# Patient Record
Sex: Male | Born: 1985 | Race: White | Hispanic: No | Marital: Married | Smoking: Never smoker
Health system: Southern US, Community
[De-identification: ages and names within clinical notes are randomized; demographics above are authoritative.]

## PROBLEM LIST (undated history)

## (undated) DIAGNOSIS — I10 Essential (primary) hypertension: Secondary | ICD-10-CM

## (undated) HISTORY — DX: Essential (primary) hypertension: I10

---

## 2011-07-01 ENCOUNTER — Ambulatory Visit (HOSPITAL_COMMUNITY)
Admission: RE | Admit: 2011-07-01 | Discharge: 2011-07-01 | Disposition: A | Discharge status: Home / Self Care | Payer: BC Managed Care – PPO | Source: Ambulatory Visit | Attending: Family Medicine | Admitting: Family Medicine

## 2011-07-01 ENCOUNTER — Other Ambulatory Visit: Payer: Self-pay | Admitting: Family Medicine

## 2011-07-01 ENCOUNTER — Other Ambulatory Visit (HOSPITAL_COMMUNITY): Payer: Self-pay | Admitting: Family Medicine

## 2011-07-01 DIAGNOSIS — R52 Pain, unspecified: Secondary | ICD-10-CM

## 2011-07-01 DIAGNOSIS — N508 Other specified disorders of male genital organs: Secondary | ICD-10-CM | POA: Insufficient documentation

## 2011-07-01 DIAGNOSIS — N509 Disorder of male genital organs, unspecified: Secondary | ICD-10-CM | POA: Insufficient documentation

## 2011-12-14 ENCOUNTER — Other Ambulatory Visit: Payer: Self-pay | Admitting: Family Medicine

## 2011-12-14 DIAGNOSIS — N433 Hydrocele, unspecified: Secondary | ICD-10-CM

## 2011-12-16 ENCOUNTER — Ambulatory Visit (HOSPITAL_COMMUNITY): Payer: BC Managed Care – PPO

## 2012-07-03 IMAGING — US US SCROTUM
1 series · 14 of 25 positions shown · non-contrast
Comparison: None.

CLINICAL DATA: Pain

SCROTAL ULTRASOUND
DOPPLER ULTRASOUND OF THE TESTICLES
TECHNIQUE: Complete ultrasound examination of the testicles,
epididymis, and other scrotal structures was performed.  Color and
spectral Doppler ultrasound were also utilized to evaluate blood
flow to the testicles.

[Series 1: us scrotum · 0.08mm/px · 14 of 74 slices shown]
[im 1/74]
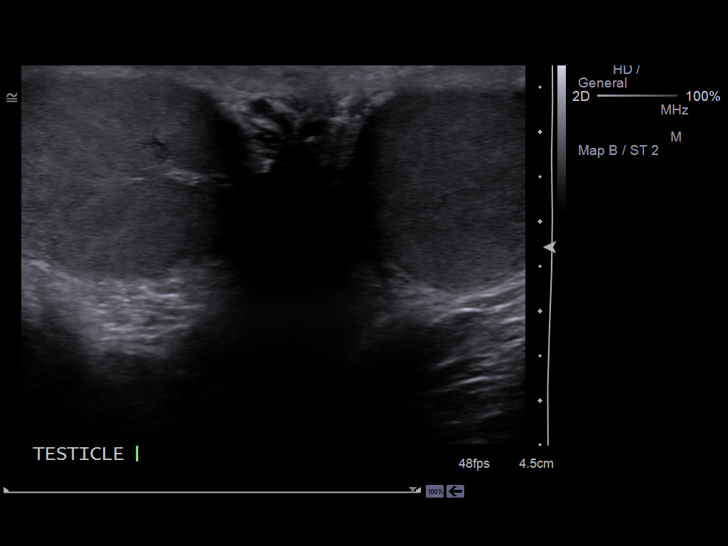
[im 7/74]
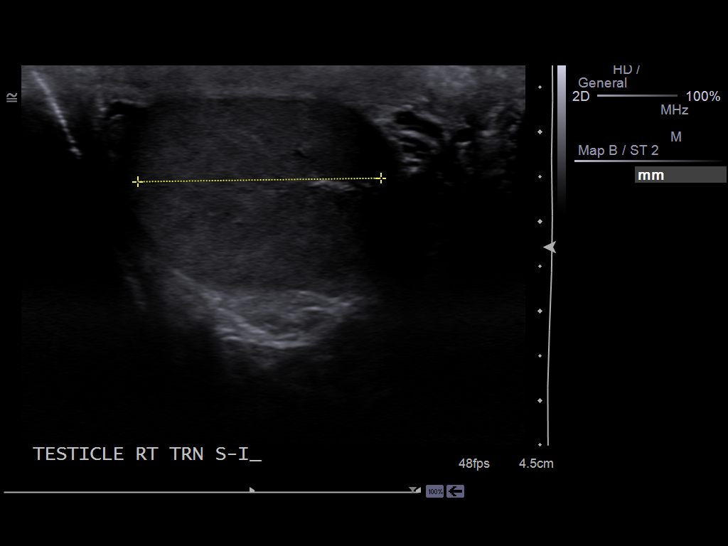
[im 13/74]
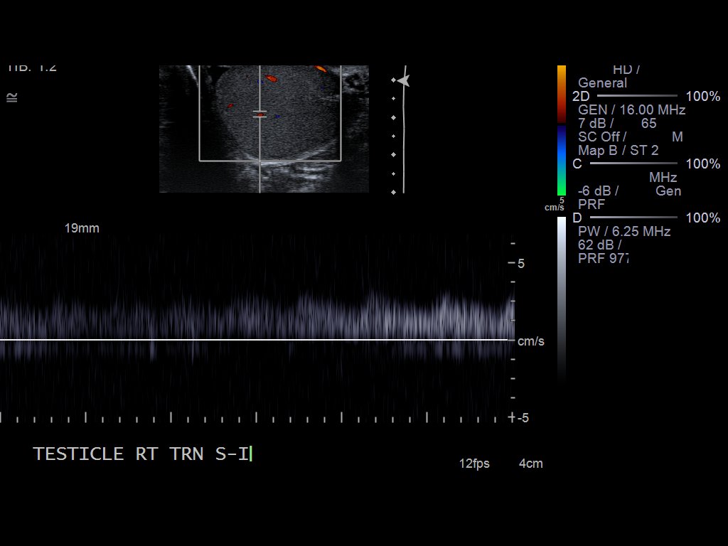
[im 19/74]
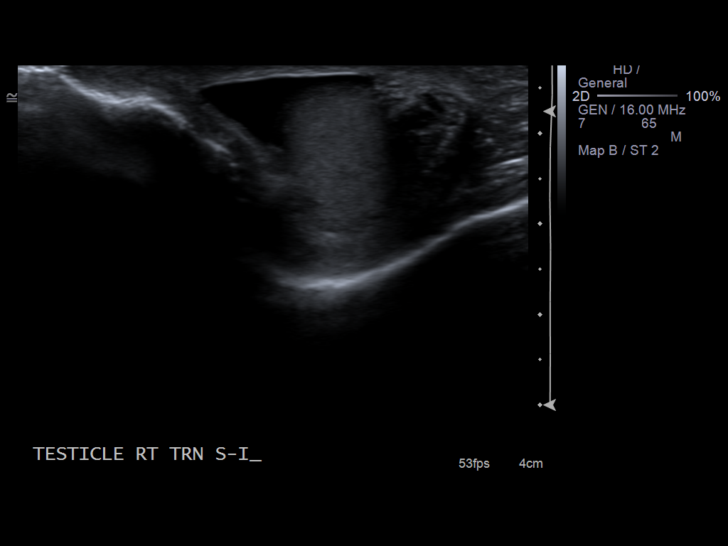
[im 25/74]
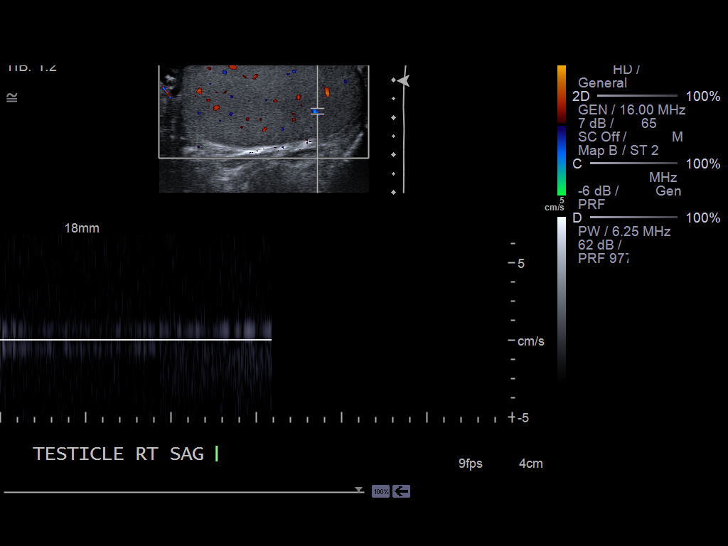
[im 28/74]
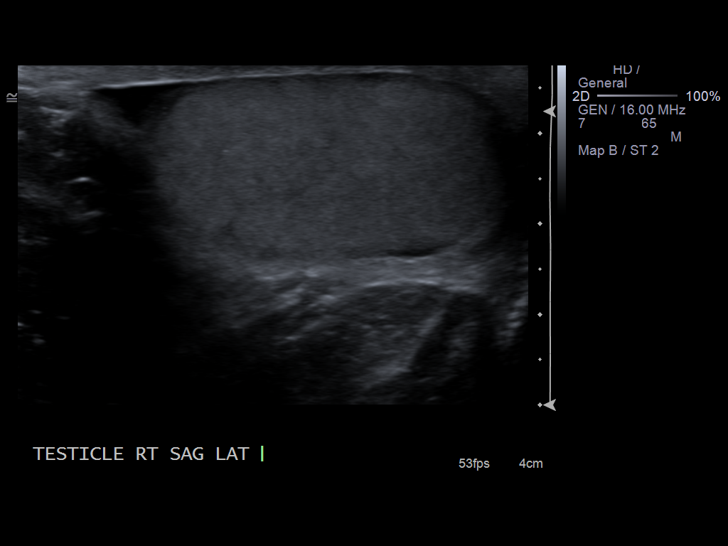
[im 34/74]
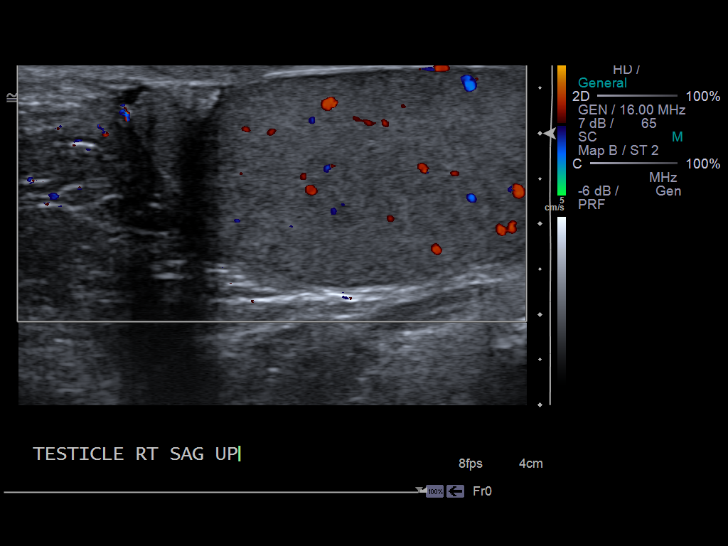
[im 40/74]
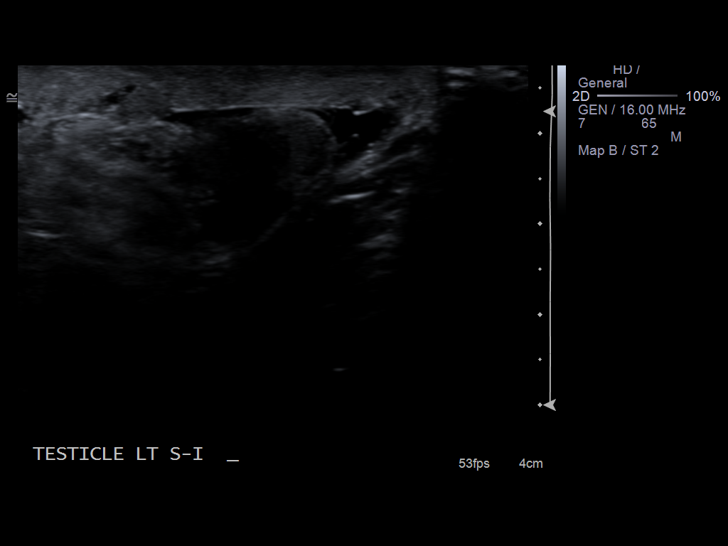
[im 46/74]
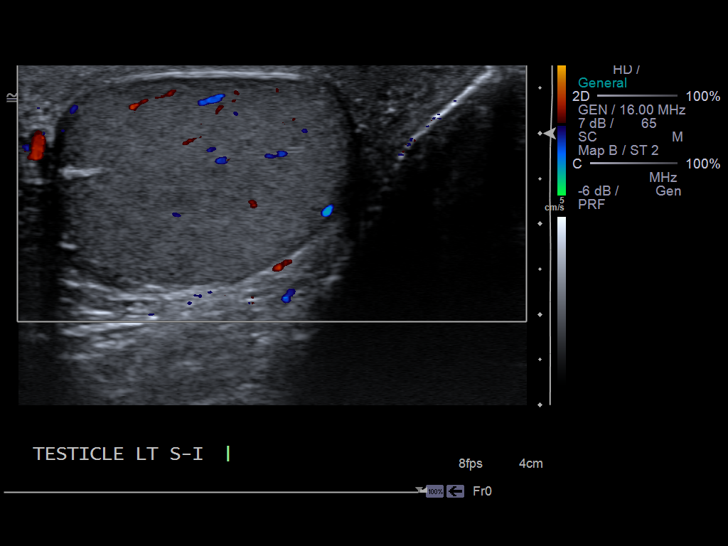
[im 49/74]
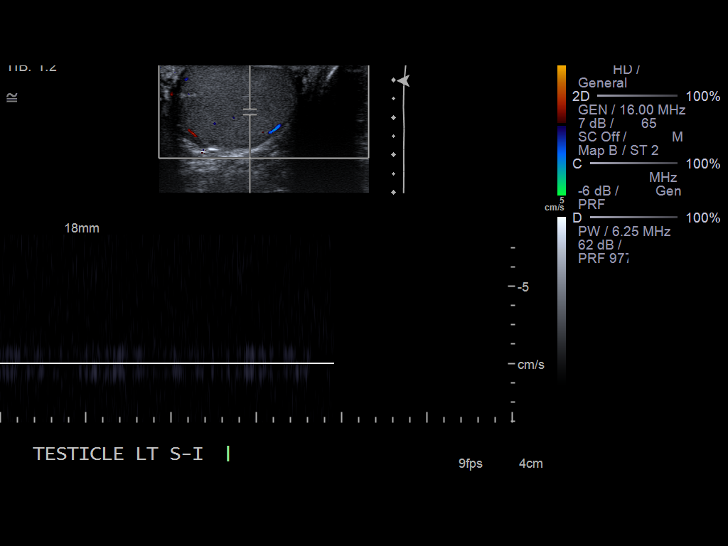
[im 55/74]
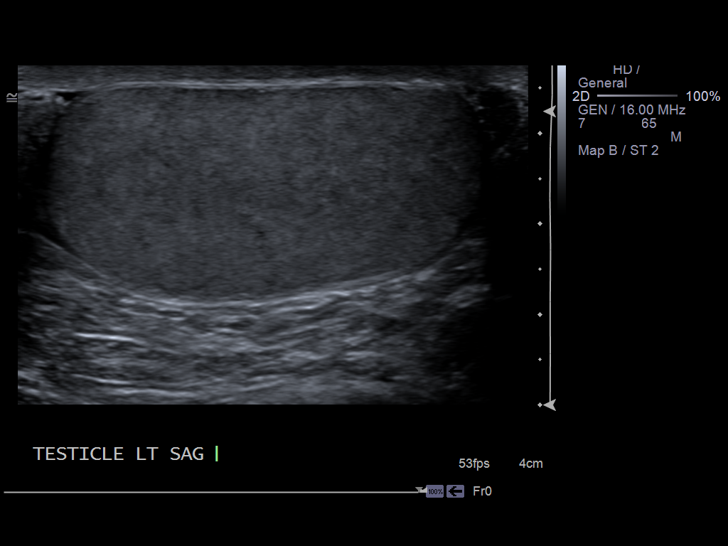
[im 61/74]
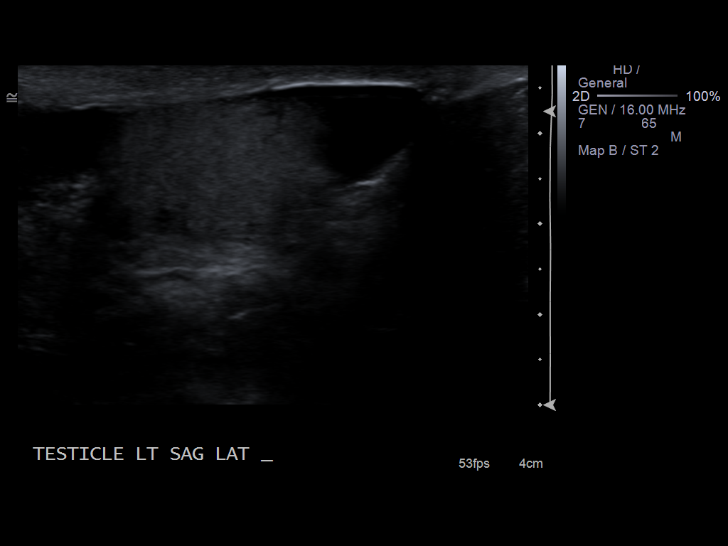
[im 67/74]
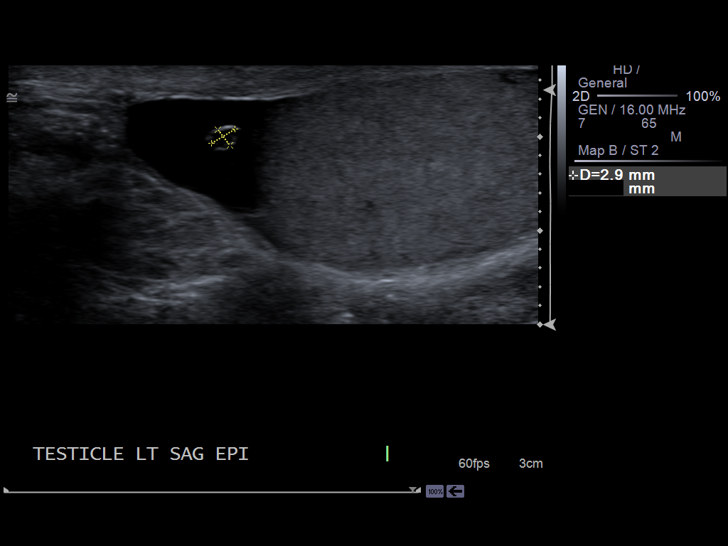
[im 74/74]
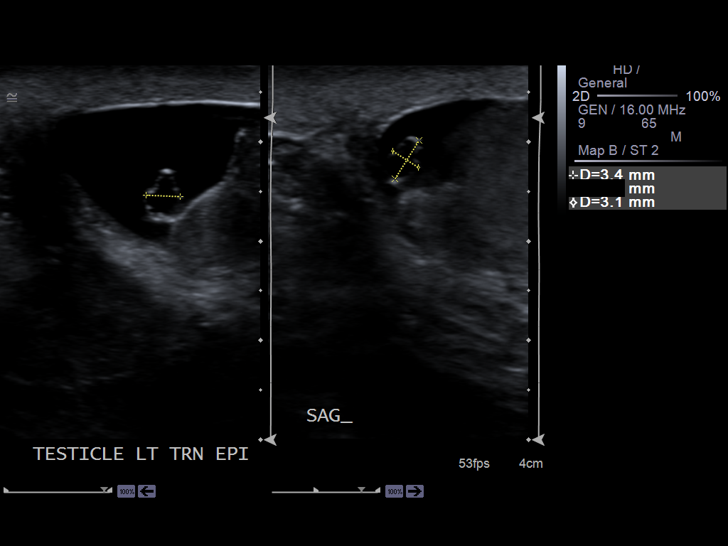

[14 of 25 positions shown; findings below may reference images not displayed]

FINDINGS: The testicles are symmetric in size and echogenicity.
No testicular masses are seen, and there is no evidence of
microlithiasis.

The right epididymis appears normal.  There is a small cyst within
the left epididymis measuring 3.4 x 4.6 x 3.1 mm.

There is no evidence of varicocele, or other extra-testicular
abnormality.

Small bilateral hydroceles.

Blood flow is seen within both testicles on color Doppler
sonography.  Doppler spectral waveforms show both arterial and
venous flow signal in both testicles.
IMPRESSION: 1.  No evidence for testicular mass or torsion.
2.  Small bilateral hydroceles.
3.  Left epididymal cyst.

## 2012-10-10 ENCOUNTER — Encounter: Payer: Self-pay | Admitting: Family Medicine

## 2012-10-16 ENCOUNTER — Ambulatory Visit (INDEPENDENT_AMBULATORY_CARE_PROVIDER_SITE_OTHER): Payer: BC Managed Care – PPO | Admitting: Family Medicine

## 2012-10-16 ENCOUNTER — Encounter: Payer: Self-pay | Admitting: Family Medicine

## 2012-10-16 ENCOUNTER — Ambulatory Visit (INDEPENDENT_AMBULATORY_CARE_PROVIDER_SITE_OTHER): Payer: BC Managed Care – PPO

## 2012-10-16 VITALS — BP 110/90 | HR 96 | Temp 97.8°F | Ht 66.0 in | Wt 251.0 lb

## 2012-10-16 DIAGNOSIS — Z0000 Encounter for general adult medical examination without abnormal findings: Secondary | ICD-10-CM

## 2012-10-16 DIAGNOSIS — E669 Obesity, unspecified: Secondary | ICD-10-CM

## 2012-10-16 DIAGNOSIS — I10 Essential (primary) hypertension: Secondary | ICD-10-CM

## 2012-10-16 MED ORDER — LISINOPRIL 5 MG PO TABS: 5.0000 mg | ORAL_TABLET | Freq: Every day | ORAL | Status: DC

## 2012-10-16 NOTE — Patient Instructions (Addendum)
FOBT given Discussed therapeutic lifestyle changes and exercise Recheck in 4-6 weeks and get BMP  Monitor blood pressures  by the nurse at work Arrange appointment with Tammy the clinical pharmacist to help him work with his diet and lose weight Should try to get eye exam

## 2012-10-16 NOTE — Progress Notes (Addendum)
  Subjective:    Patient ID: David Johnston, male    DOB: [birth year], 27 y.o.   MRN: [id number]  HPI   Patient presents today for complete physical exam. Patient has a history of several weeks of blood pressure elevated and monitored by the nurse at work. He has no history of symptoms associated with this .   Review of Systems  Constitutional: Negative.   HENT: Negative.   Eyes: Positive for itching (this AM).  Respiratory: Negative.   Cardiovascular: Negative.   Gastrointestinal: Negative.   Genitourinary: Negative.   Musculoskeletal: Negative.   Allergic/Immunologic: Negative.   Neurological: Negative.   Psychiatric/Behavioral: Positive for sleep disturbance (due to snoring).       Objective:   Physical Exam   BP 136/97  Pulse 70  Temp(Src) 97.8 F (36.6 C) (Oral)  Ht 5\' 6"  (1.676 m)  Wt 251 lb (113.853 kg)  BMI 40.53 kg/m2  The patient appeared well nourished and normally developed but overweight, alert and oriented to time and place. Speech, behavior and judgement appear normal. Vital signs as documented.  Head exam is unremarkable.Mouth and ears were normal.Slight nasal congestion bilaterally. No carotid bruit.  Carotid upstrokes are brisk bilaterally. No cervical adenopathy. No thyroid enlargement Lungs are clear anteriorly and posteriorly to auscultation. Normal respiratory effort. Cardiac exam reveals regular rate and rhythm @ 96/min. First and second heart sounds normal. No murmurs, rubs or gallops.  Abdominal exam reveals normal bowl sounds, no masses, no organomegaly and no aortic enlargement. No inguinal adenopathy. There is obesity. Rectal exam was negative. Was unable to feel bilateral hydroceles today. No hernia palpated. Extremities are nonedematous and both femoral and pedal pulses are normal. Skin without pallor or jaundice.  Warm and dry, without rash. Neurologic exam reveals normal deep tendon reflexes and normal sensation.   EKG: normal EKG,  slightly irregular rhythm [facility] reading (PRIMARY) by  Dr. Rudi Heap, chest x-ray appears to be within normal limits cardiac size is normal lungs appear to be clear                                       Assessment & Plan:  Complete physical exam  Increased blood pressure History of bilateral hydrocele, appear smaller today   Appointment with Tammy to help with weight loss BMP and blood pressure rechecked in about 4 weeks, this could be done with Tammy's visit Bring outside blood pressure readings to this visit

## 2012-10-17 NOTE — Addendum Note (Signed)
Addended by: Ernestina Penna on: 10/17/2012 02:31 PM   Modules accepted: Orders, Level of Service

## 2012-11-13 ENCOUNTER — Ambulatory Visit (INDEPENDENT_AMBULATORY_CARE_PROVIDER_SITE_OTHER): Payer: BC Managed Care – PPO | Admitting: Pharmacist

## 2012-11-13 VITALS — BP 122/86 | HR 72 | Ht 66.0 in | Wt 254.0 lb

## 2012-11-13 DIAGNOSIS — I10 Essential (primary) hypertension: Secondary | ICD-10-CM

## 2012-11-13 NOTE — Progress Notes (Signed)
Filed Vitals:   11/13/12 1448  BP: 122/86  Pulse: 72    Filed Weights   11/13/12 1448  Weight: 254 lb (115.214 kg)   Body mass index is 41.02 kg/(m^2).   Mrs. Nees was referred by Dr. Rudi Heap for obesity and hypertension.   Patient has tried restricting calories in past with minimal success.  Patient had labs recently checked by his employer that showed normal lipid panel, FBG and BMET.  Patient has recently started lisinopril 5mg  daily with good results.  He is having workplace nurse check BP occasionally at work.   SH:  Married, no children, non smoker, works for [employer].   Exercise - occassional - weight lifting Diet - eat out during day  Breakfast - Honey Bunches of Oats cereal w/ 2% milk  Lunch - [place] sandwich, [place]  Supper - salmon, pork chops, hamburger steak, chicken + vegetables - green beans, corn Salad with 1000 island dressing.  Snacks - not usually  Drinks sweet tea regularly    Assessment: Obesity with BMI greater than 40 Comorbidites - hypertension Signs of hypercortisolism: none Contraindications to weight loss: none Patient readiness to commit to diet and activity changes: excellent Barriers to weight loss: time limitations  Plan: 1. Goal weight loss is 15 lbs in 3 -4 months.   2. Diet interventions: moderate (500 kCal/d) deficit diet  Proper food choices reviewed: yes 1. Discontinue drinking sweet tea 2. Increase lean proteins and whole grains 3. Increase fresh vegetable and fruits  Preparation techniques reviewed: yes  Careful meal planning, specifically adding a few low calorie snacks  Handouts given: Info about fast food menu ([employer]), sample 5 day diet 3. Exercise intervention:   Informal measures, e.g. taking stairs instead of elevator: yes  Formal exercise regimen: yes (add cardio exercise to weight training.  Specifially suggested cycling to prevent aggrevation of knee pain/shin  splints.) 4. Patient to keep a weight log that we will review at follow up. 5. Follow up: 1 month and as needed.

## 2012-11-14 LAB — BASIC METABOLIC PANEL WITH GFR
CO2: 27 mEq/L (ref 19–32)
Calcium: 9.2 mg/dL (ref 8.4–10.5)
Chloride: 102 mEq/L (ref 96–112)
GFR, Est Non African American: >89 mL/min
Sodium: 137 mEq/L (ref 135–145)

## 2012-11-15 ENCOUNTER — Telehealth: Payer: Self-pay | Admitting: Pharmacist

## 2012-11-15 NOTE — Telephone Encounter (Signed)
BMP = WNL Continue lisinoril 5mg  daily

## 2013-10-19 IMAGING — CR DG CHEST 2V
2 series · 2 of 2 positions shown · non-contrast
Comparison: None.

CLINICAL DATA: High blood pressure

CHEST - 2 VIEW

[view not recorded (1 of 2)]
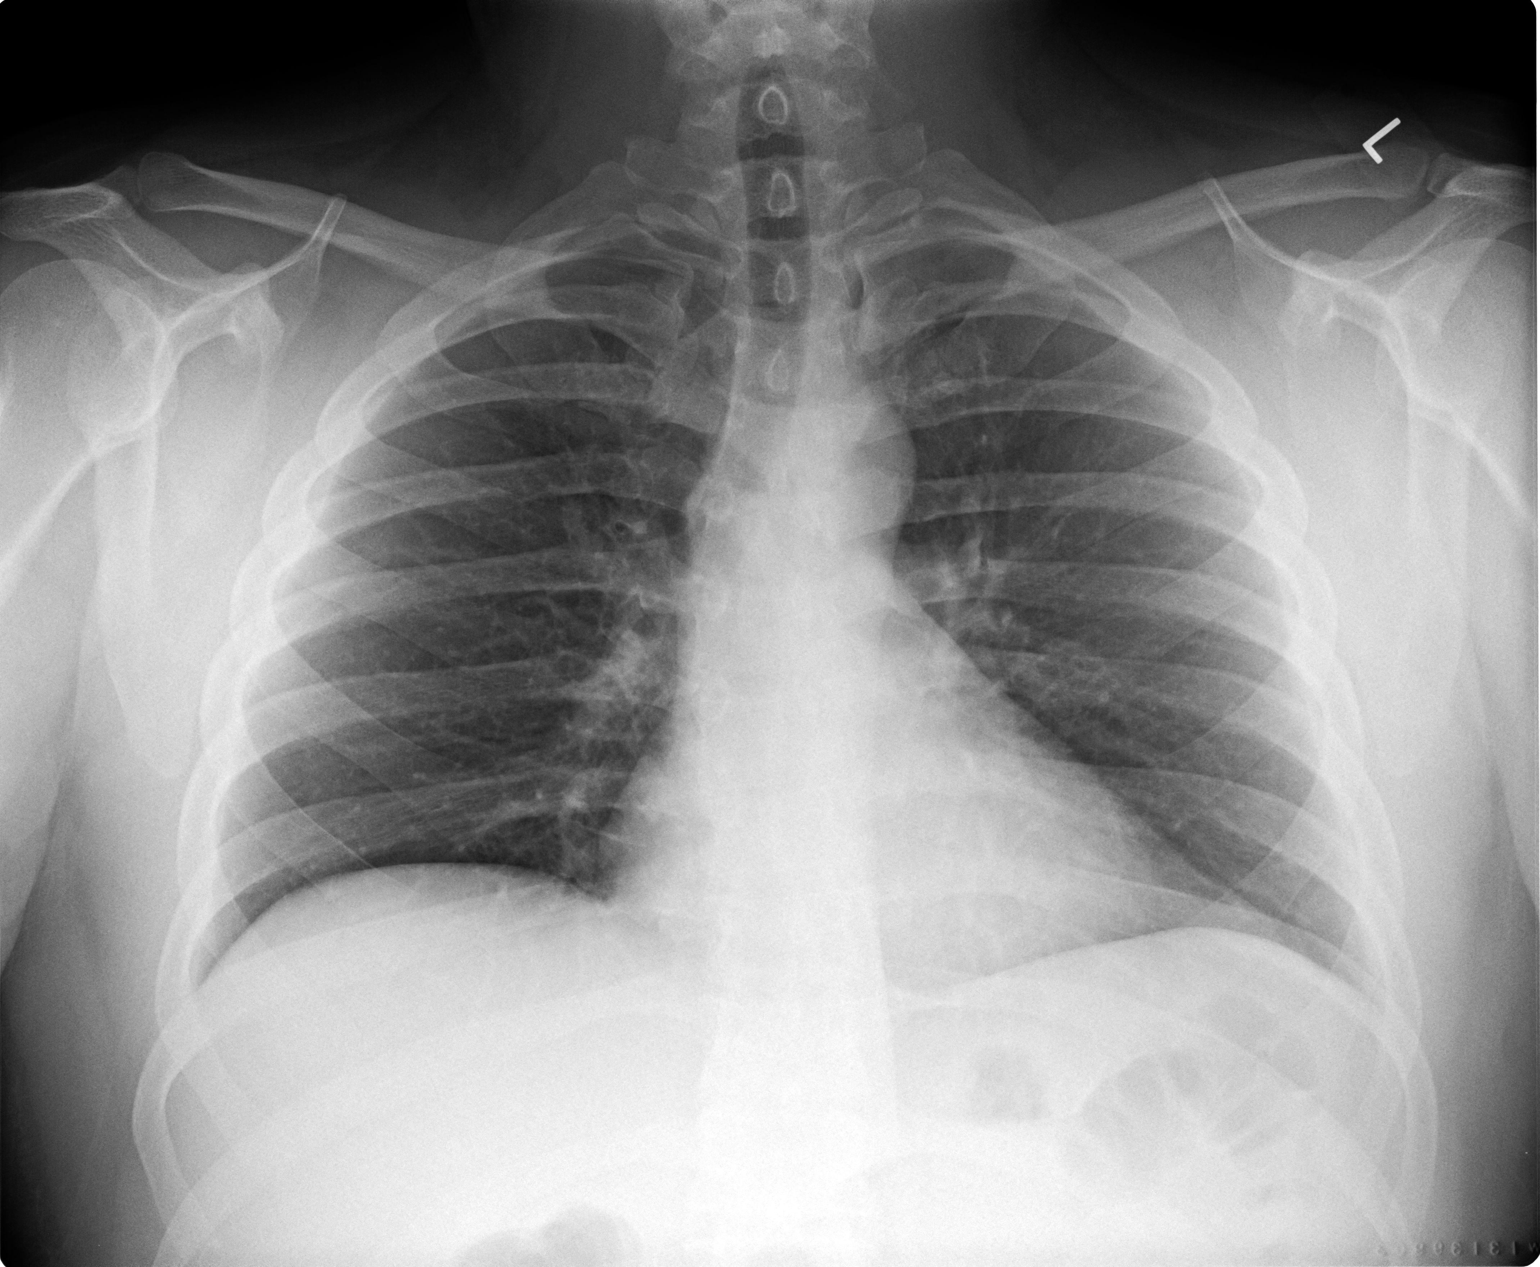

[view not recorded (2 of 2)]
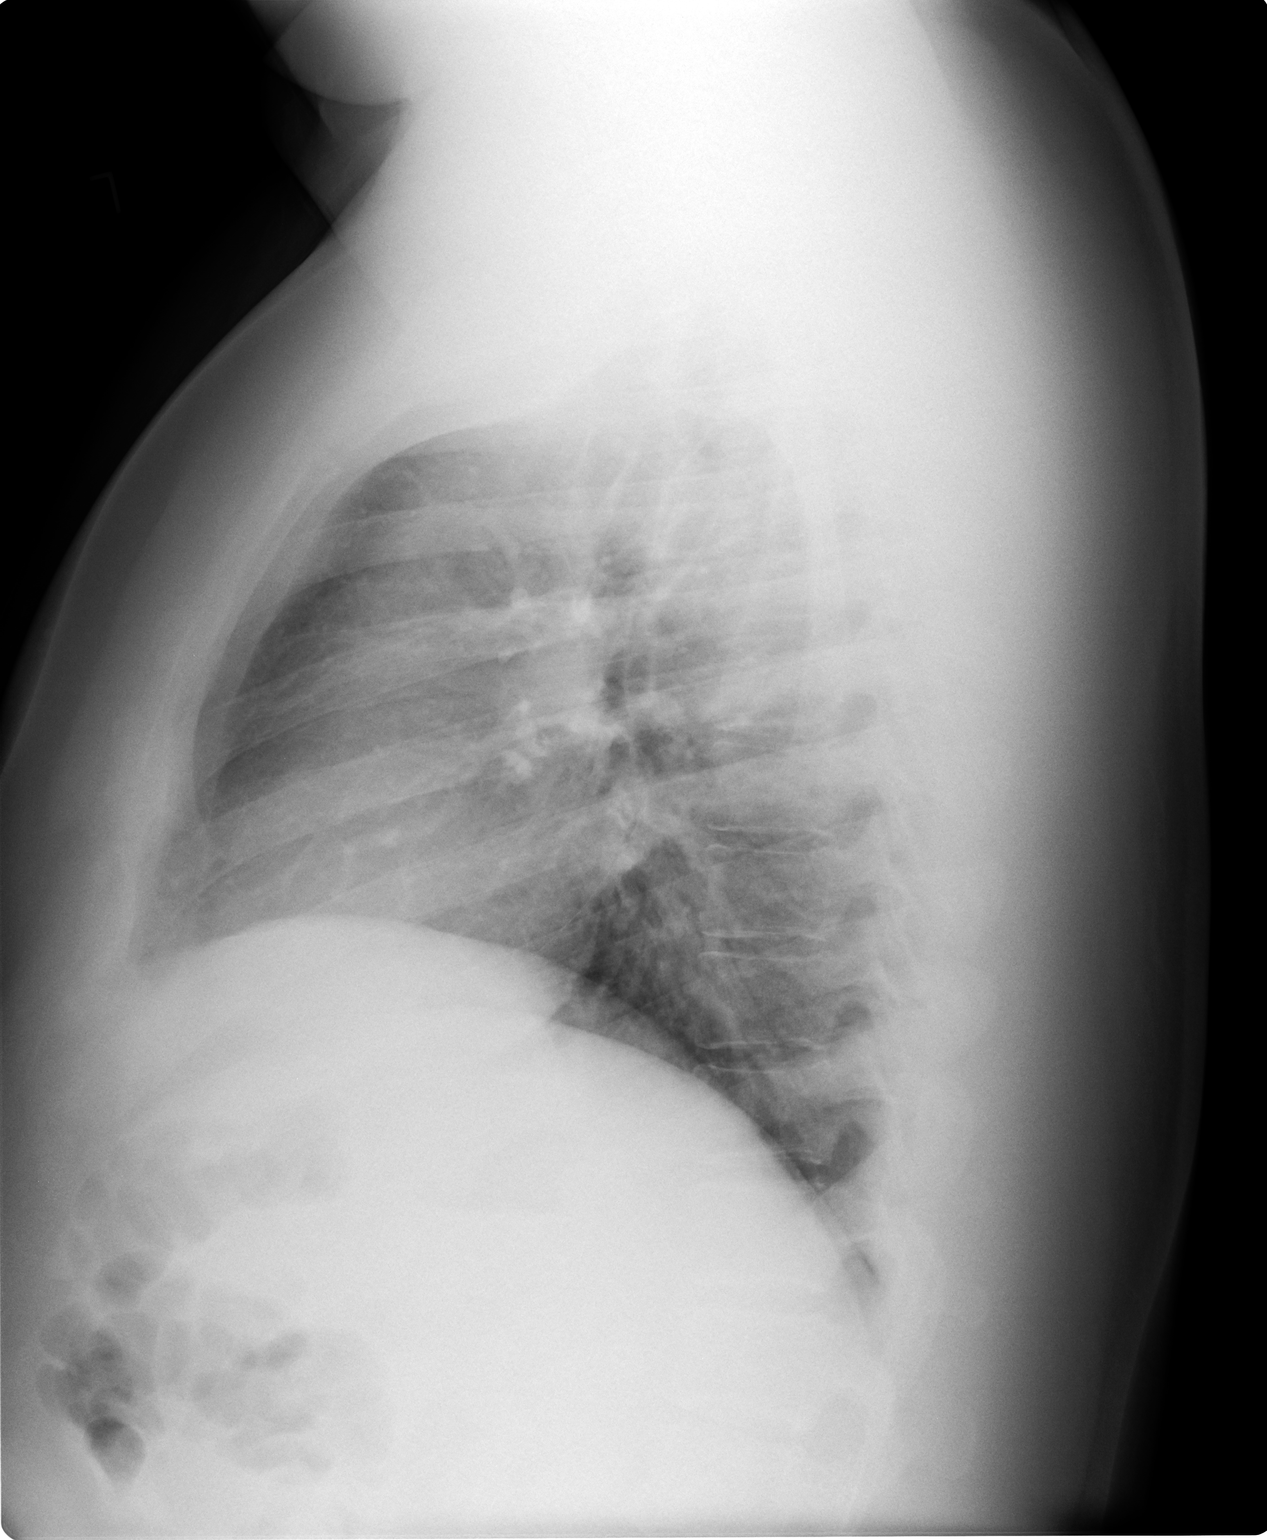

[2 of 2 positions shown; findings below may reference images not displayed]

FINDINGS: No active infiltrate or effusion is seen.  Mediastinal
contours appear normal.  The heart is within normal limits in size.
No bony abnormality is seen.
IMPRESSION: No active lung disease.

## 2016-11-08 ENCOUNTER — Ambulatory Visit (INDEPENDENT_AMBULATORY_CARE_PROVIDER_SITE_OTHER): Payer: BLUE CROSS/BLUE SHIELD | Admitting: Family Medicine

## 2016-11-08 ENCOUNTER — Encounter: Payer: Self-pay | Admitting: Family Medicine

## 2016-11-08 VITALS — BP 138/95 | HR 81 | Temp 97.2°F | Ht 66.0 in | Wt 274.0 lb

## 2016-11-08 DIAGNOSIS — Z0000 Encounter for general adult medical examination without abnormal findings: Secondary | ICD-10-CM | POA: Diagnosis not present

## 2016-11-08 DIAGNOSIS — I10 Essential (primary) hypertension: Secondary | ICD-10-CM

## 2016-11-08 NOTE — Progress Notes (Signed)
BP (!) 154/109   Pulse 81   Temp 97.2 F (36.2 C) (Oral)   Ht '5\' 6"'  (1.676 m)   Wt 274 lb (124.3 kg)   BMI 44.22 kg/m    Subjective:    Patient ID: David Johnston, male    DOB: [birth year], 31 y.o.   MRN: [id number]  HPI: David Johnston is a 31 y.o. male presenting on 11/08/2016 for Annual Exam   HPI Adult well exam and hypertension check Patient is coming in for adult well exam and labs and to get his blood pressure rechecked. They have been monitoring his blood pressure at work at least 4 times a year and he says the top numbers usually less than 140 but the bottom number is frequently over 85 and often over 90.Patient denies any chest pain, shortness of breath, headaches or vision issues, abdominal complaints, diarrhea, nausea, vomiting, or joint issues. Previously he was on a very low-dose of the blood pressure pill that helped him but he has not been on it since. He has gained weight over the past 4 years and his BMI is now 12. We talked about options to help improve that and he does want to make some changes. His wife is currently pregnant with her second child and he wants to get healthier for them. He does admit that he snores loudly at night and may stop breathing sometimes per his wife but it's he does not think it's very often, he will talk to her more about this.  Relevant past medical, surgical, family and social history reviewed and updated as indicated. Interim medical history since our last visit reviewed. Allergies and medications reviewed and updated.  Review of Systems  Constitutional: Negative for chills and fever.  Respiratory: Negative for shortness of breath and wheezing.   Cardiovascular: Negative for chest pain and leg swelling.  Gastrointestinal: Negative for abdominal pain, constipation, diarrhea, nausea and vomiting.  Musculoskeletal: Negative for back pain and gait problem.  Skin: Negative for rash.  All other systems reviewed and are negative.   Per  HPI unless specifically indicated above  Social History   Social History  . Marital status: Married    Spouse name: N/A  . Number of children: N/A  . Years of education: N/A   Occupational History  . Not on file.   Social History Main Topics  . Smoking status: Never Smoker  . Smokeless tobacco: Former Systems developer  . Alcohol use Yes     Comment: occasional  . Drug use: No  . Sexual activity: Yes    Partners: Female   Other Topics Concern  . Not on file   Social History Narrative  . No narrative on file    History reviewed. No pertinent surgical history.  Family History  Problem Relation Age of Onset  . Hypertension Father     Allergies as of 11/08/2016   No Known Allergies     Medication List    as of 11/08/2016  9:35 AM   You have not been prescribed any medications.        Objective:    BP (!) 154/109   Pulse 81   Temp 97.2 F (36.2 C) (Oral)   Ht '5\' 6"'  (1.676 m)   Wt 274 lb (124.3 kg)   BMI 44.22 kg/m   Wt Readings from Last 3 Encounters:  11/08/16 274 lb (124.3 kg)  11/13/12 254 lb (115.2 kg)  10/16/12 251 lb (113.9 kg)    Physical Exam  Constitutional: He is oriented to person, place, and time. He appears well-developed and well-nourished. No distress.  Eyes: Conjunctivae are normal. No scleral icterus.  Neck: Neck supple. No thyromegaly present.  Cardiovascular: Normal rate, regular rhythm, normal heart sounds and intact distal pulses.   No murmur heard. Pulmonary/Chest: Effort normal and breath sounds normal. No respiratory distress. He has no wheezes. He has no rales.  Abdominal: Soft. Bowel sounds are normal. He exhibits no distension. There is no tenderness. There is no rebound.  Genitourinary:  Genitourinary Comments: Patient declined exam today.  Musculoskeletal: Normal range of motion. He exhibits no edema.  Lymphadenopathy:    He has no cervical adenopathy.  Neurological: He is alert and oriented to person, place, and time. Coordination  normal.  Skin: Skin is warm and dry. No rash noted. He is not diaphoretic.  Psychiatric: He has a normal mood and affect. His behavior is normal.  Nursing note and vitals reviewed.       Assessment & Plan:   Problem List Items Addressed This Visit      Cardiovascular and Mediastinum   Essential hypertension, benign   Relevant Orders   CMP14+EGFR   Lipid panel     Other   Severe obesity (BMI >= 40) (HCC)   Relevant Orders   CBC with Differential/Platelet   TSH    Other Visit Diagnoses    Well adult exam    -  Primary   Relevant Orders   CMP14+EGFR   CBC with Differential/Platelet   Lipid panel       Follow up plan: Return in about 4 weeks (around 12/06/2016), or if symptoms worsen or fail to improve, for Hypertension recheck.  Caryl Pina, MD [facility] 11/08/2016, 9:35 AM

## 2016-11-09 LAB — CMP14+EGFR
ALK PHOS: 33 IU/L — AB (ref 39–117)
ALT: 30 IU/L (ref 0–44)
AST: 23 IU/L (ref 0–40)
Albumin/Globulin Ratio: 1.6 (ref 1.2–2.2)
Albumin: 4.3 g/dL (ref 3.5–5.5)
BILIRUBIN TOTAL: 0.4 mg/dL (ref 0.0–1.2)
BUN / CREAT RATIO: 13 (ref 9–20)
BUN: 12 mg/dL (ref 6–20)
CHLORIDE: 100 mmol/L (ref 96–106)
CO2: 25 mmol/L (ref 18–29)
CREATININE: 0.9 mg/dL (ref 0.76–1.27)
Calcium: 9 mg/dL (ref 8.7–10.2)
GFR calc Af Amer: 130 mL/min/{1.73_m2} (ref >59)
GFR calc non Af Amer: 113 mL/min/{1.73_m2} (ref >59)
Globulin, Total: 2.7 g/dL (ref 1.5–4.5)
Glucose: 94 mg/dL (ref 65–99)
POTASSIUM: 4 mmol/L (ref 3.5–5.2)
Sodium: 139 mmol/L (ref 134–144)
Total Protein: 7 g/dL (ref 6.0–8.5)

## 2016-11-09 LAB — LIPID PANEL
Chol/HDL Ratio: 3.9 ratio (ref 0.0–5.0)
Cholesterol, Total: 178 mg/dL (ref 100–199)
HDL: 46 mg/dL (ref >39)
LDL Calculated: 107 mg/dL — ABNORMAL HIGH (ref 0–99)
TRIGLYCERIDES: 124 mg/dL (ref 0–149)
VLDL Cholesterol Cal: 25 mg/dL (ref 5–40)

## 2016-11-09 LAB — CBC WITH DIFFERENTIAL/PLATELET
BASOS ABS: 0 10*3/uL (ref 0.0–0.2)
Basos: 0 %
EOS (ABSOLUTE): 0.1 10*3/uL (ref 0.0–0.4)
Eos: 1 %
Hematocrit: 45.4 % (ref 37.5–51.0)
Hemoglobin: 15.6 g/dL (ref 13.0–17.7)
IMMATURE GRANS (ABS): 0 10*3/uL (ref 0.0–0.1)
IMMATURE GRANULOCYTES: 0 %
LYMPHS ABS: 2.5 10*3/uL (ref 0.7–3.1)
Lymphs: 35 %
MCH: 30.9 pg (ref 26.6–33.0)
MCHC: 34.4 g/dL (ref 31.5–35.7)
MCV: 90 fL (ref 79–97)
MONOS ABS: 0.8 10*3/uL (ref 0.1–0.9)
Monocytes: 12 %
NEUTROS PCT: 52 %
Neutrophils Absolute: 3.8 10*3/uL (ref 1.4–7.0)
PLATELETS: 212 10*3/uL (ref 150–379)
RBC: 5.05 x10E6/uL (ref 4.14–5.80)
RDW: 14.1 % (ref 12.3–15.4)
WBC: 7.3 10*3/uL (ref 3.4–10.8)

## 2016-11-09 LAB — TSH: TSH: 2.79 u[IU]/mL (ref 0.450–4.500)

## 2016-12-08 ENCOUNTER — Ambulatory Visit (INDEPENDENT_AMBULATORY_CARE_PROVIDER_SITE_OTHER): Payer: BLUE CROSS/BLUE SHIELD | Admitting: Family Medicine

## 2016-12-08 ENCOUNTER — Encounter: Payer: Self-pay | Admitting: Family Medicine

## 2016-12-08 VITALS — BP 122/89 | HR 83 | Temp 97.1°F | Ht 66.0 in | Wt 273.0 lb

## 2016-12-08 DIAGNOSIS — I10 Essential (primary) hypertension: Secondary | ICD-10-CM

## 2016-12-08 NOTE — Assessment & Plan Note (Signed)
Patient wants to monitor it for a little bit longer and continue with dietary changes.

## 2016-12-08 NOTE — Progress Notes (Signed)
   BP 122/89   Pulse 83   Temp 97.1 F (36.2 C) (Oral)   Ht 5\' 6"  (1.676 m)   Wt 273 lb (123.8 kg)   BMI 44.06 kg/m    Subjective:    Patient ID: David Johnston, male    DOB: July 08, 1986, 31 y.o.   MRN: [id number]  HPI: David Johnston is a 31 y.o. male presenting on 12/08/2016 for Hypertension (4 week followup)   HPI Hypertension Patient is currently on dietary control and lifestyle changes, and her blood pressure today is 122/89, he says that at his workplace he has been consisting only running in the 88-90 range on the diastolic.Marland Kitchen. Patient denies any lightheadedness or dizziness. Patient denies headaches, blurred vision, chest pains, shortness of breath, or weakness. Denies any side effects from medication and is content with current medication.   Relevant past medical, surgical, family and social history reviewed and updated as indicated. Interim medical history since our last visit reviewed. Allergies and medications reviewed and updated.  Review of Systems  Constitutional: Negative for chills and fever.  Respiratory: Negative for shortness of breath and wheezing.   Cardiovascular: Negative for chest pain and leg swelling.  Musculoskeletal: Negative for back pain and gait problem.  Skin: Negative for rash.  Neurological: Negative for dizziness, weakness, light-headedness and headaches.  All other systems reviewed and are negative.   Per HPI unless specifically indicated above        Objective:    BP 122/89   Pulse 83   Temp 97.1 F (36.2 C) (Oral)   Ht 5\' 6"  (1.676 m)   Wt 273 lb (123.8 kg)   BMI 44.06 kg/m   Wt Readings from Last 3 Encounters:  12/08/16 273 lb (123.8 kg)  11/08/16 274 lb (124.3 kg)  11/13/12 254 lb (115.2 kg)    Physical Exam  Constitutional: He is oriented to person, place, and time. He appears well-developed and well-nourished. No distress.  Eyes: Conjunctivae are normal. No scleral icterus.  Neck: Neck supple. No thyromegaly present.    Cardiovascular: Normal rate, regular rhythm, normal heart sounds and intact distal pulses.   No murmur heard. Pulmonary/Chest: Effort normal and breath sounds normal. No respiratory distress. He has no wheezes. He has no rales.  Musculoskeletal: Normal range of motion. He exhibits no edema.  Lymphadenopathy:    He has no cervical adenopathy.  Neurological: He is alert and oriented to person, place, and time. Coordination normal.  Skin: Skin is warm and dry. No rash noted. He is not diaphoretic.  Psychiatric: He has a normal mood and affect. His behavior is normal.  Nursing note and vitals reviewed.       Assessment & Plan:   Problem List Items Addressed This Visit      Cardiovascular and Mediastinum   Essential hypertension, benign - Primary    Patient wants to monitor it for a little bit longer and continue with dietary changes.           Follow up plan: Return in about 6 months (around 06/10/2017), or if symptoms worsen or fail to improve, for Hypertension.  Counseling provided for all of the vaccine components No orders of the defined types were placed in this encounter.   Arville CareJoshua Dettinger, MD [facility] 12/08/2016, 9:19 AM

## 2017-02-02 ENCOUNTER — Telehealth: Payer: Self-pay | Admitting: Family Medicine

## 2017-02-02 NOTE — Telephone Encounter (Signed)
That is fine we can change it to a level II

## 2017-02-02 NOTE — Telephone Encounter (Signed)
Patient called and is upset he is being billed $143 (applied to deductible) for his 5 minute visit about his blood pressure. He wants to know if you can change to a lower level? If you agree I can correct to level 2 instead of level 3. Thanks NVR Inconya

## 2017-05-30 ENCOUNTER — Ambulatory Visit: Payer: BLUE CROSS/BLUE SHIELD | Admitting: Family Medicine

## 2018-09-22 ENCOUNTER — Ambulatory Visit (INDEPENDENT_AMBULATORY_CARE_PROVIDER_SITE_OTHER): Payer: Commercial Managed Care - PPO

## 2018-09-22 ENCOUNTER — Ambulatory Visit (INDEPENDENT_AMBULATORY_CARE_PROVIDER_SITE_OTHER): Payer: Commercial Managed Care - PPO | Admitting: Podiatry

## 2018-09-22 ENCOUNTER — Other Ambulatory Visit: Payer: Self-pay

## 2018-09-22 DIAGNOSIS — M79671 Pain in right foot: Secondary | ICD-10-CM

## 2018-09-22 DIAGNOSIS — M722 Plantar fascial fibromatosis: Secondary | ICD-10-CM

## 2018-09-22 DIAGNOSIS — M216X1 Other acquired deformities of right foot: Secondary | ICD-10-CM

## 2018-09-22 NOTE — Patient Instructions (Signed)

## 2018-09-24 ENCOUNTER — Encounter: Payer: Self-pay | Admitting: Podiatry

## 2018-09-25 ENCOUNTER — Ambulatory Visit (INDEPENDENT_AMBULATORY_CARE_PROVIDER_SITE_OTHER): Payer: Commercial Managed Care - PPO | Admitting: Podiatry

## 2018-09-25 ENCOUNTER — Telehealth: Payer: Self-pay | Admitting: Podiatry

## 2018-09-25 ENCOUNTER — Other Ambulatory Visit: Payer: Self-pay

## 2018-09-25 DIAGNOSIS — M722 Plantar fascial fibromatosis: Secondary | ICD-10-CM

## 2018-09-25 DIAGNOSIS — M779 Enthesopathy, unspecified: Secondary | ICD-10-CM | POA: Diagnosis not present

## 2018-09-25 DIAGNOSIS — M778 Other enthesopathies, not elsewhere classified: Secondary | ICD-10-CM

## 2018-09-25 MED ORDER — HYDROCODONE-ACETAMINOPHEN 5-325 MG PO TABS: 1.0000 | ORAL_TABLET | Freq: Three times a day (TID) | ORAL | 0 refills | Status: AC

## 2018-09-25 MED ORDER — MELOXICAM 15 MG PO TABS: 15.0000 mg | ORAL_TABLET | Freq: Every day | ORAL | 1 refills | Status: AC

## 2018-09-25 NOTE — Telephone Encounter (Signed)
I requested schedulers to contact pt for an appt today, pt is scheduled today at 3:15pm with Dr. Logan Bores.

## 2018-09-25 NOTE — Telephone Encounter (Signed)
Patient was seen this past Friday. Received an injection in his foot-he has severe pain and its very swollen. Please call  Patient

## 2018-09-25 NOTE — Telephone Encounter (Signed)
Patient called following up on earlier call, pt was informed that the nurse would give him a call back as soon as she could.

## 2018-09-26 NOTE — Progress Notes (Signed)
   HPI: 33 year old male presents the office today after having an anti-inflammatory injection on 09/22/2022 plantar fasciitis to the right foot by Dr. Samuella Cota here in the office.  He states that over the weekend he notices significant amount of pain.  He is unable to work and put pressure on his foot.  He says by Saturday morning he was in so much pain he could not walk.  Patient continues to have significant pain with throbbing to the lateral aspect of the foot with tightness.  He also has noticed some swelling to the area.  He presents for further treatment evaluation  Past Medical History:  Diagnosis Date  . Hypertension      Physical Exam: General: The patient is alert and oriented x3 in no acute distress.  Dermatology: Skin is warm, dry and supple bilateral lower extremities. Negative for open lesions or macerations.  Vascular: Palpable pedal pulses bilaterally. Capillary refill within normal limits.  Skin is warm to touch with some mild erythema and edema  Neurological: Epicritic and protective threshold grossly intact bilaterally.   Musculoskeletal Exam: Range of motion within normal limits to all pedal and ankle joints bilateral. Muscle strength 5/5 in all groups bilateral.  Significant amount of pain on palpation diffusely throughout the foot  Assessment: 1.  Acute flare reaction secondary to steroidal anti-inflammatory injection   Plan of Care:  1. Patient evaluated. 2.  Today we will prescribe meloxicam 15 mg daily anti-inflammatory to help patient alleviate some symptoms 3.  Prescription for Vicodin 5/325 mg #20 every 8 hours as needed severe acute pain 4.  Today we will place the patient in a immobilization cam boot.  Weightbearing as tolerated.  Cam boot dispensed today 5.  Return to clinic in 2 weeks with Dr. Estanislado Pandy, DPM [facility]  Dr. Felecia Shelling, DPM    [address] [place], [place]  [address]                Office [phone]  Fax [phone]

## 2018-10-10 NOTE — Progress Notes (Signed)
  Subjective:  Patient ID: David Johnston, male    DOB: Jan 11, 1986,  MRN: [id number]  Chief Complaint  Patient presents with  . Foot Pain    Right heel pain, some posterior heel pain, 1 wk duration, no known injuries, aching pain    33 y.o. male presents with the above complaint.   Review of Systems: Negative except as noted in the HPI. Denies N/V/F/Ch.  Past Medical History:  Diagnosis Date  . Hypertension     Current Outpatient Medications:  .  HYDROcodone-acetaminophen (NORCO/VICODIN) 5-325 MG tablet, Take 1 tablet by mouth every 8 (eight) hours., Disp: 20 tablet, Rfl: 0 .  meloxicam (MOBIC) 15 MG tablet, Take 1 tablet (15 mg total) by mouth daily., Disp: 30 tablet, Rfl: 1  Social History   Tobacco Use  Smoking Status Never Smoker  Smokeless Tobacco Former User    No Known Allergies Objective:  There were no vitals filed for this visit. There is no height or weight on file to calculate BMI. Constitutional Well developed. Well nourished.  Vascular Dorsalis pedis pulses palpable bilaterally. Posterior tibial pulses palpable bilaterally. Capillary refill normal to all digits.  No cyanosis or clubbing noted. Pedal hair growth normal.  Neurologic Normal speech. Oriented to person, place, and time. Epicritic sensation to light touch grossly present bilaterally.  Dermatologic Nails well groomed and normal in appearance. No open wounds. No skin lesions.  Orthopedic: Normal joint ROM without pain or crepitus bilaterally. No visible deformities. Tender to palpation at the calcaneal tuber right. No pain with calcaneal squeeze right. Ankle ROM diminished range of motion right. Silfverskiold Test: negative right.   Radiographs: Taken and reviewed. No acute fractures or dislocations. No evidence of stress fracture.  Plantar heel spur present. Posterior heel spur present.   Assessment:   1. Plantar fasciitis, right   2. Pain of right heel   3. Acquired equinus deformity  of right foot    Plan:  Patient was evaluated and treated and all questions answered.  Plantar Fasciitis, right - XR reviewed as above.  - Educated on icing and stretching. Instructions given.  - Injection delivered to the plantar fascia as below. - DME: Night splint dispensed.  Procedure: Injection Tendon/Ligament Location: Right plantar fascia at the glabrous junction; medial approach. Skin Prep: alcohol Injectate: 1 cc 0.5% marcaine plain, 1 cc dexamethasone phosphate, 0.5 cc kenalog 10. Disposition: Patient tolerated procedure well. Injection site dressed with a band-aid.  Return in about 4 weeks (around 10/20/2018) for Plantar fasciitis, right foot.

## 2018-10-13 ENCOUNTER — Ambulatory Visit: Payer: Commercial Managed Care - PPO | Admitting: Podiatry

## 2018-10-19 ENCOUNTER — Encounter: Payer: Self-pay | Admitting: Podiatry

## 2018-10-19 ENCOUNTER — Other Ambulatory Visit: Payer: Self-pay

## 2018-10-19 ENCOUNTER — Ambulatory Visit (INDEPENDENT_AMBULATORY_CARE_PROVIDER_SITE_OTHER): Payer: Commercial Managed Care - PPO | Admitting: Podiatry

## 2018-10-19 VITALS — Temp 97.5°F

## 2018-10-19 DIAGNOSIS — M722 Plantar fascial fibromatosis: Secondary | ICD-10-CM

## 2018-10-19 DIAGNOSIS — M216X1 Other acquired deformities of right foot: Secondary | ICD-10-CM

## 2018-10-19 NOTE — Progress Notes (Signed)
  Subjective:  Patient ID: David Johnston, male    DOB: 1985-12-08,  MRN: [id number]  Chief Complaint  Patient presents with  . Plantar Fasciitis    " my foot feels much better, I did have a reaction to the steroid injection, my foot pain got worse and it was swollen for 3-4 days. The pain has since improved, I only have morning and afternoon pain but that seems manageable"     33 y.o. male presents with the above complaint.  States that his pain is worse in the morning and in the afternoon while at work has been using a plantar fascial release sock which she thinks is helped a little bit.  Review of Systems: Negative except as noted in the HPI. Denies N/V/F/Ch.  Past Medical History:  Diagnosis Date  . Hypertension     Current Outpatient Medications:  .  HYDROcodone-acetaminophen (NORCO/VICODIN) 5-325 MG tablet, Take 1 tablet by mouth every 8 (eight) hours., Disp: 20 tablet, Rfl: 0 .  meloxicam (MOBIC) 15 MG tablet, Take 1 tablet (15 mg total) by mouth daily., Disp: 30 tablet, Rfl: 1  Social History   Tobacco Use  Smoking Status Never Smoker  Smokeless Tobacco Former User    No Known Allergies Objective:   Vitals:   10/19/18 0850  Temp: (!) 97.5 F (36.4 C)   There is no height or weight on file to calculate BMI. Constitutional Well developed. Well nourished.  Vascular Dorsalis pedis pulses palpable bilaterally. Posterior tibial pulses palpable bilaterally. Capillary refill normal to all digits.  No cyanosis or clubbing noted. Pedal hair growth normal.  Neurologic Normal speech. Oriented to person, place, and time. Epicritic sensation to light touch grossly present bilaterally.  Dermatologic Nails well groomed and normal in appearance. No open wounds. No skin lesions.  Orthopedic:  Ankle joint range of motion still to 0 degrees.  Continue pain to palpation about the right medial heel tuber   Radiographs: None  Assessment:   1. Plantar fasciitis, right   2.  Acquired equinus deformity of right foot    Plan:  Patient was evaluated and treated and all questions answered.  Plantar Fasciitis, right - Still having post-static dyskinesia pain. Continue night splint. - Still having pain at the end of the day.  Dispensed plantar fascial brace.  Will benefit from custom orthotics.  We will look into coverage.  Will continue use plantar fascial brace until he can transition to custom molded orthotics - DME: Plantar fascial brace dispensed.   No follow-ups on file.

## 2018-10-23 ENCOUNTER — Telehealth: Payer: Self-pay | Admitting: Podiatry

## 2018-10-23 NOTE — Telephone Encounter (Signed)
Called pt with benefit information for the orthotics.(covered @ 80% after deductible)Pt has not met so the 398.00 would be his responsibility. I did give him the information as far setting up a payment plan. And pt stated he could not do it at this time and I told pt I would hold his foam box for a couple of months for him.

## 2019-04-24 ENCOUNTER — Other Ambulatory Visit: Payer: Self-pay | Admitting: *Deleted

## 2019-04-24 DIAGNOSIS — Z20822 Contact with and (suspected) exposure to covid-19: Secondary | ICD-10-CM

## 2019-04-26 LAB — NOVEL CORONAVIRUS, NAA: SARS-CoV-2, NAA: NOT DETECTED
